# Patient Record
Sex: Female | Born: 1960 | Race: Black or African American | Hispanic: No | Marital: Married | Smoking: Former smoker
Health system: Southern US, Community
[De-identification: ages and names within clinical notes are randomized; demographics above are authoritative.]

## PROBLEM LIST (undated history)

## (undated) DIAGNOSIS — I10 Essential (primary) hypertension: Secondary | ICD-10-CM

## (undated) DIAGNOSIS — K219 Gastro-esophageal reflux disease without esophagitis: Secondary | ICD-10-CM

## (undated) DIAGNOSIS — E7800 Pure hypercholesterolemia, unspecified: Secondary | ICD-10-CM

## (undated) DIAGNOSIS — E119 Type 2 diabetes mellitus without complications: Secondary | ICD-10-CM

---

## 2001-06-18 HISTORY — PX: ABDOMINAL HYSTERECTOMY: SHX81

## 2005-04-13 ENCOUNTER — Ambulatory Visit: Payer: Self-pay | Admitting: Obstetrics and Gynecology

## 2006-04-15 ENCOUNTER — Ambulatory Visit: Payer: Self-pay | Admitting: Obstetrics and Gynecology

## 2006-05-10 ENCOUNTER — Ambulatory Visit: Payer: Self-pay | Admitting: Nurse Practitioner

## 2007-04-29 ENCOUNTER — Ambulatory Visit: Payer: Self-pay | Admitting: Nurse Practitioner

## 2007-07-29 ENCOUNTER — Ambulatory Visit: Payer: Self-pay

## 2007-08-13 ENCOUNTER — Ambulatory Visit: Payer: Self-pay | Admitting: Nurse Practitioner

## 2008-05-12 ENCOUNTER — Ambulatory Visit: Payer: Self-pay | Admitting: Nurse Practitioner

## 2009-06-20 ENCOUNTER — Ambulatory Visit: Payer: Self-pay | Admitting: Family Medicine

## 2010-07-11 ENCOUNTER — Ambulatory Visit: Payer: Self-pay | Admitting: Family Medicine

## 2010-10-02 ENCOUNTER — Ambulatory Visit: Payer: Self-pay | Admitting: Gastroenterology

## 2010-10-02 HISTORY — PX: COLONOSCOPY: SHX174

## 2011-07-25 ENCOUNTER — Ambulatory Visit: Payer: Self-pay | Admitting: Family Medicine

## 2011-10-17 ENCOUNTER — Emergency Department: Payer: Self-pay | Admitting: Emergency Medicine

## 2011-10-29 ENCOUNTER — Encounter: Payer: Self-pay | Admitting: Family Medicine

## 2012-09-08 ENCOUNTER — Ambulatory Visit: Payer: Self-pay | Admitting: Family Medicine

## 2014-02-18 ENCOUNTER — Ambulatory Visit: Payer: Self-pay | Admitting: Family Medicine

## 2014-05-10 ENCOUNTER — Ambulatory Visit: Payer: Self-pay | Admitting: Gastroenterology

## 2014-10-11 LAB — SURGICAL PATHOLOGY

## 2015-03-23 ENCOUNTER — Other Ambulatory Visit: Payer: Self-pay | Admitting: Family Medicine

## 2015-03-23 DIAGNOSIS — Z1231 Encounter for screening mammogram for malignant neoplasm of breast: Secondary | ICD-10-CM

## 2015-03-25 ENCOUNTER — Ambulatory Visit: Payer: Self-pay

## 2015-03-31 ENCOUNTER — Ambulatory Visit
Admission: RE | Admit: 2015-03-31 | Discharge: 2015-03-31 | Disposition: A | Discharge status: Home / Self Care | Payer: BLUE CROSS/BLUE SHIELD | Source: Ambulatory Visit | Attending: Family Medicine | Admitting: Family Medicine

## 2015-03-31 DIAGNOSIS — Z1231 Encounter for screening mammogram for malignant neoplasm of breast: Secondary | ICD-10-CM | POA: Insufficient documentation

## 2016-04-18 ENCOUNTER — Other Ambulatory Visit: Payer: Self-pay | Admitting: Family Medicine

## 2016-04-18 DIAGNOSIS — Z1231 Encounter for screening mammogram for malignant neoplasm of breast: Secondary | ICD-10-CM

## 2016-05-24 ENCOUNTER — Ambulatory Visit
Admission: RE | Admit: 2016-05-24 | Discharge: 2016-05-24 | Disposition: A | Discharge status: Home / Self Care | Payer: BLUE CROSS/BLUE SHIELD | Source: Ambulatory Visit | Attending: Family Medicine | Admitting: Family Medicine

## 2016-05-24 DIAGNOSIS — Z1231 Encounter for screening mammogram for malignant neoplasm of breast: Secondary | ICD-10-CM | POA: Diagnosis not present

## 2017-05-16 ENCOUNTER — Other Ambulatory Visit: Payer: Self-pay | Admitting: Family Medicine

## 2017-05-16 DIAGNOSIS — Z1231 Encounter for screening mammogram for malignant neoplasm of breast: Secondary | ICD-10-CM

## 2017-05-29 ENCOUNTER — Ambulatory Visit
Admission: RE | Admit: 2017-05-29 | Discharge: 2017-05-29 | Disposition: A | Discharge status: Home / Self Care | Payer: BLUE CROSS/BLUE SHIELD | Source: Ambulatory Visit | Attending: Family Medicine | Admitting: Family Medicine

## 2017-05-29 DIAGNOSIS — Z1231 Encounter for screening mammogram for malignant neoplasm of breast: Secondary | ICD-10-CM | POA: Diagnosis not present

## 2018-04-21 ENCOUNTER — Other Ambulatory Visit: Payer: Self-pay | Admitting: Family Medicine

## 2018-04-21 DIAGNOSIS — Z1231 Encounter for screening mammogram for malignant neoplasm of breast: Secondary | ICD-10-CM

## 2018-06-02 ENCOUNTER — Ambulatory Visit
Admission: RE | Admit: 2018-06-02 | Discharge: 2018-06-02 | Disposition: A | Discharge status: Home / Self Care | Payer: BLUE CROSS/BLUE SHIELD | Source: Ambulatory Visit | Attending: Family Medicine | Admitting: Family Medicine

## 2018-06-02 DIAGNOSIS — Z1231 Encounter for screening mammogram for malignant neoplasm of breast: Secondary | ICD-10-CM | POA: Insufficient documentation

## 2019-04-30 ENCOUNTER — Other Ambulatory Visit: Payer: Self-pay | Admitting: Family Medicine

## 2019-04-30 DIAGNOSIS — Z1231 Encounter for screening mammogram for malignant neoplasm of breast: Secondary | ICD-10-CM

## 2019-06-05 ENCOUNTER — Ambulatory Visit
Admission: RE | Admit: 2019-06-05 | Discharge: 2019-06-05 | Disposition: A | Discharge status: Home / Self Care | Payer: BC Managed Care – PPO | Source: Ambulatory Visit | Attending: Family Medicine | Admitting: Family Medicine

## 2019-06-05 DIAGNOSIS — Z1231 Encounter for screening mammogram for malignant neoplasm of breast: Secondary | ICD-10-CM | POA: Insufficient documentation

## 2020-05-25 ENCOUNTER — Other Ambulatory Visit: Payer: Self-pay | Admitting: Family Medicine

## 2020-05-25 DIAGNOSIS — Z1231 Encounter for screening mammogram for malignant neoplasm of breast: Secondary | ICD-10-CM

## 2020-06-07 ENCOUNTER — Ambulatory Visit
Admission: RE | Admit: 2020-06-07 | Discharge: 2020-06-07 | Disposition: A | Discharge status: Home / Self Care | Payer: BC Managed Care – PPO | Source: Ambulatory Visit | Attending: Family Medicine | Admitting: Family Medicine

## 2020-06-07 ENCOUNTER — Other Ambulatory Visit: Payer: Self-pay

## 2020-06-07 DIAGNOSIS — Z1231 Encounter for screening mammogram for malignant neoplasm of breast: Secondary | ICD-10-CM | POA: Diagnosis present

## 2021-05-24 ENCOUNTER — Other Ambulatory Visit: Payer: Self-pay | Admitting: Family Medicine

## 2021-05-24 DIAGNOSIS — Z1231 Encounter for screening mammogram for malignant neoplasm of breast: Secondary | ICD-10-CM

## 2021-07-20 ENCOUNTER — Ambulatory Visit
Admission: RE | Admit: 2021-07-20 | Discharge: 2021-07-20 | Disposition: A | Discharge status: Home / Self Care | Payer: BC Managed Care – PPO | Source: Ambulatory Visit | Attending: Family Medicine | Admitting: Family Medicine

## 2021-07-20 ENCOUNTER — Other Ambulatory Visit: Payer: Self-pay

## 2021-07-20 DIAGNOSIS — Z1231 Encounter for screening mammogram for malignant neoplasm of breast: Secondary | ICD-10-CM | POA: Diagnosis present

## 2021-08-23 ENCOUNTER — Encounter: Payer: Self-pay | Admitting: Orthopedic Surgery

## 2021-08-23 ENCOUNTER — Other Ambulatory Visit: Payer: Self-pay | Admitting: Orthopedic Surgery

## 2021-08-23 NOTE — H&P (Signed)
Melinda Bean ?MRN:  [id number] ?DOB/SEX:  [birth year]female ? ?CHIEF COMPLAINT:  Painful right Knee ? ?HISTORY: ?Patient is a 61 y.o. female presented with a history of pain in the right knee. Onset of symptoms was abrupt starting several months ago with rapidly worsening course since that time. Prior procedures on the knee include none. Patient has been treated conservatively with over-the-counter NSAIDs and activity modification. Patient currently rates pain in the knee at 10 out of 10 with activity. There is pain at night. ? ?PAST MEDICAL HISTORY: ?There are no problems to display for this patient. ? ?No past medical history on file. ?No past surgical history on file.  ? ?MEDICATIONS:  (Not in a hospital admission) ? ? ?ALLERGIES:  Not on File ? ?REVIEW OF SYSTEMS:  ?Pertinent items are noted in HPI. ? ? ?FAMILY HISTORY:   ?Family History  ?Problem Relation Age of Onset  ? Breast cancer Sister 20  ? Breast cancer Sister 30  ? Breast cancer Sister   ? ? ?SOCIAL HISTORY:   ?Social History  ? ?Tobacco Use  ? Smoking status: Not on file  ? Smokeless tobacco: Not on file  ?Substance Use Topics  ? Alcohol use: Not on file  ? ?  ?EXAMINATION: ? ?Vital signs in last 24 hours: ?@VSRANGES @ ? ?General appearance: alert, cooperative, and no distress ?Neck: no JVD and supple, symmetrical, trachea midline ?Lungs: clear to auscultation bilaterally ?Heart: regular rate and rhythm, S1, S2 normal, no murmur, click, rub or gallop ?Abdomen: soft, non-tender; bowel sounds normal; no masses,  no organomegaly ?Extremities: extremities normal, atraumatic, no cyanosis or edema and Homans sign is negative, no sign of DVT ?Pulses: 2+ and symmetric ?Skin: Skin color, texture, turgor normal. No rashes or lesions ? ?Musculoskeletal:  ROM 0-110, Ligaments intact,  ?Imaging Review ?MRI medial meniscal tear ? ?Assessment/Plan: ?Medial meniscal tear ? ?Right knee arthroscopy ? ? ? ? ?08/23/2021, 3:56 PM  ? ?

## 2021-09-04 ENCOUNTER — Other Ambulatory Visit: Payer: Self-pay

## 2021-09-04 ENCOUNTER — Encounter
Admission: RE | Admit: 2021-09-04 | Discharge: 2021-09-04 | Disposition: A | Discharge status: Home / Self Care | Payer: BC Managed Care – PPO | Source: Ambulatory Visit | Attending: Orthopedic Surgery | Admitting: Orthopedic Surgery

## 2021-09-04 VITALS — Ht 67.5 in | Wt 210.0 lb

## 2021-09-04 DIAGNOSIS — Z01818 Encounter for other preprocedural examination: Secondary | ICD-10-CM

## 2021-09-04 HISTORY — DX: Type 2 diabetes mellitus without complications: E11.9

## 2021-09-04 HISTORY — DX: Gastro-esophageal reflux disease without esophagitis: K21.9

## 2021-09-04 HISTORY — DX: Essential (primary) hypertension: I10

## 2021-09-04 HISTORY — DX: Pure hypercholesterolemia, unspecified: E78.00

## 2021-09-04 NOTE — Patient Instructions (Addendum)
Your procedure is scheduled on: Monday 09/11/21 ?Report to the Registration Desk on the 1st floor of the [facility]. ?To find out your arrival time, please call [phone] between 1PM - 3PM on: Friday 09/08/21 ? ?REMEMBER: ?Instructions that are not followed completely may result in serious medical risk, up to and including death; or upon the discretion of your surgeon and anesthesiologist your surgery may need to be rescheduled. ? ?Do not eat or drink after midnight the night before surgery.  ?No gum chewing, lozengers or hard candies. ? ?TAKE THESE MEDICATIONS THE MORNING OF SURGERY WITH A SIP OF WATER: ?amLODipine (NORVASC) 10 MG tablet ?metoprolol tartrate (LOPRESSOR) 75 MG tablet ? ?pantoprazole (PROTONIX) 40 MG tablet (take one the night before and one on the morning of surgery - helps to prevent nausea after surgery.) ? ?Stop taking your Metformin 2 days prior to surgery. Take last dose on Friday 09/08/21 . ? ?Stop taking your aspirin 5 days prior to surgery. Take last dose on Tuesday 09/05/21. ? ?One week prior to surgery: ?Stop Anti-inflammatories (NSAIDS) such as Advil, Aleve, Ibuprofen, Motrin, Naproxen, Naprosyn and Aspirin based products such as Excedrin, Goodys Powder, BC Powder. ? ?Stop taking your cholecalciferol (VITAMIN D) 25 MCG (1000 UNIT) tablet, Multiple Vitamin (MULTIVITAMIN WITH MINERALS) TABS tablet, and ANY other OVER THE COUNTER supplements until after surgery. ?You may however, continue to take Tylenol if needed for pain up until the day of surgery. ? ?No Alcohol for 24 hours before or after surgery. ? ?No Smoking including e-cigarettes for 24 hours prior to surgery.  ?No chewable tobacco products for at least 6 hours prior to surgery.  ?No nicotine patches on the day of surgery. ? ?Do not use any "recreational" drugs for at least a week prior to your surgery.  ?Please be advised that the combination of cocaine and anesthesia may have negative outcomes, up to and including death. ?If  you test positive for cocaine, your surgery will be cancelled. ? ?On the morning of surgery brush your teeth with toothpaste and water, you may rinse your mouth with mouthwash if you wish. ?Do not swallow any toothpaste or mouthwash. ? ?Use CHG Soap as directed on instruction sheet. ? ?Do not wear jewelry, make-up, hairpins, clips or nail polish. ? ?Do not wear lotions, powders, or perfumes.  ? ?Do not shave body from the neck down 48 hours prior to surgery just in case you cut yourself which could leave a site for infection.  ?Also, freshly shaved skin may become irritated if using the CHG soap. ? ?Do not bring valuables to the hospital. [facility] is not responsible for any missing/lost belongings or valuables.  ? ?Notify your doctor if there is any change in your medical condition (cold, fever, infection). ? ?Wear comfortable clothing (specific to your surgery type) to the hospital. ? ?After surgery, you can help prevent lung complications by doing breathing exercises.  ?Take deep breaths and cough every 1-2 hours. Your doctor may order a device called an Incentive Spirometer to help you take deep breaths. ? ?If you are being discharged the day of surgery, you will not be allowed to drive home. ?You will need a responsible adult (18 years or older) to drive you home and stay with you that night.  ? ?If you are taking public transportation, you will need to have a responsible adult (18 years or older) with you. ?Please confirm with your physician that it is acceptable to use public transportation.  ? ?Please  call the Pre-admissions Testing Dept. at [phone] if you have any questions about these instructions. ? ?Surgery Visitation Policy: ? ?Patients undergoing a surgery or procedure may have one family member or support person with them as long as that person is not COVID-19 positive or experiencing its symptoms.  ?That person may remain in the waiting area during the procedure and may rotate out with other  people. ? ?Inpatient Visitation:   ? ?Visiting hours are 7 a.m. to 8 p.m. ?Up to two visitors ages 16+ are allowed at one time in a patient room. The visitors may rotate out with other people during the day. Visitors must check out when they leave, or other visitors will not be allowed. One designated support person may remain overnight. ?The visitor must pass COVID-19 screenings, use hand sanitizer when entering and exiting the patient?s room and wear a mask at all times, including in the patient?s room. ?Patients must also wear a mask when staff or their visitor are in the room. ?Masking is required regardless of vaccination status.  ?

## 2021-09-05 ENCOUNTER — Encounter
Admission: RE | Admit: 2021-09-05 | Discharge: 2021-09-05 | Disposition: A | Discharge status: Home / Self Care | Payer: BC Managed Care – PPO | Source: Ambulatory Visit | Attending: Orthopedic Surgery | Admitting: Orthopedic Surgery

## 2021-09-05 ENCOUNTER — Encounter: Payer: Self-pay | Admitting: Urgent Care

## 2021-09-05 DIAGNOSIS — Z01818 Encounter for other preprocedural examination: Secondary | ICD-10-CM | POA: Diagnosis present

## 2021-09-05 DIAGNOSIS — Z01810 Encounter for preprocedural cardiovascular examination: Secondary | ICD-10-CM | POA: Diagnosis not present

## 2021-09-05 LAB — BASIC METABOLIC PANEL
Anion gap: 8 (ref 5–15)
BUN: 11 mg/dL (ref 8–23)
CO2: 30 mmol/L (ref 22–32)
Calcium: 9.7 mg/dL (ref 8.9–10.3)
Chloride: 102 mmol/L (ref 98–111)
Creatinine, Ser: 0.64 mg/dL (ref 0.44–1.00)
GFR, Estimated: >60 mL/min (ref >60)
Glucose, Bld: 115 mg/dL — ABNORMAL HIGH (ref 70–99)
Potassium: 3.1 mmol/L — ABNORMAL LOW (ref 3.5–5.1)
Sodium: 140 mmol/L (ref 135–145)

## 2021-09-06 ENCOUNTER — Telehealth: Payer: Self-pay | Admitting: Urgent Care

## 2021-09-06 DIAGNOSIS — Z01812 Encounter for preprocedural laboratory examination: Secondary | ICD-10-CM

## 2021-09-06 DIAGNOSIS — E876 Hypokalemia: Secondary | ICD-10-CM

## 2021-09-06 MED ORDER — POTASSIUM CHLORIDE CRYS ER 20 MEQ PO TBCR: 20.0000 meq | EXTENDED_RELEASE_TABLET | Freq: Every day | ORAL | 0 refills | Status: AC

## 2021-09-06 NOTE — Progress Notes (Signed)
?  Melinda Bean ?Perioperative Services: Pre-Admission/Anesthesia Testing ? ?Abnormal Lab Notification ?  ?Date: 09/06/21 ? ?Name: Melinda Bean Melinda Bean ?MRN:   [id number] ? ?Re: Abnormal labs noted during PAT appointment  ? ?Notified:  ?Provider Name Provider Role Notification Mode  ?Cassell Smiles, MD Orthopedics (Surgeon) Routed and/or faxed via [name]  ?Darreld Mclean, MD Primary Care Provider Routed and/or faxed via [name]  ? ?Clinical Information and Notes:  ?ABNORMAL LAB VALUE(S): ?Lab Results  ?Component Value Date  ? K 3.1 (L) 09/05/2021  ? ?Melinda Bean is scheduled for an elective RIGHT KNEE ARTHROSCOPY WITH MEDIAL MENISECTOMY on 09/11/2021. In review of her medication reconciliation, it is noted that the patient is taking prescribed diuretic medications (HCTZ 25 mg). Please note, in efforts to promote a safe and effective anesthetic course, per current guidelines/standards set by the [name] anesthesia team, the minimal acceptable K+ level for the patient to proceed with general anesthesia is 3.0 mmol/L. With that being said, if the patient drops any lower, her elective procedure will need to be postponed until K+ is better optimized. To avoid complications and potential case cancellation, we will send in prescription for oral supplement in efforts to optimize patient's preprocedural K+ level ? ?Impression and Plans:  ?Melinda Bean is scheduled for the above procedure on 09/11/2021.  As part of her preoperative work-up, patient presented to the PAT clinic on the morning of 09/06/2021 for lab work.  Patient noted to be mildly hypokalemic at 3.1 mmol/L.  Sending in a prescription for oral replacement therapy as follows: ? ?Meds ordered this encounter  ?Medications  ? potassium chloride SA (KLOR-CON M) 20 MEQ tablet  ?  Sig: Take 1 tablet (20 mEq total) by mouth daily for 5 days.  ?  Dispense:  5 tablet  ?  Refill:  0  ? ?Patient encouraged to take medication as prescribed.  Additionally, she  was advised to follow-up with her PCP following surgery to discuss need for repeat lab work and potential change in her diuretic medication.  Alternatively, could consider daily oral K+ supplementation. Will defer that decision to her PCP. Order placed to have K+ rechecked on the day of her procedure to ensure correction of the noted derangement.   ? 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 ? ?Encounter Diagnoses  ?Name Primary?  ? Diuretic-induced hypokalemia   ? Pre-operative laboratory examination Yes  ? ?Melinda Mulling, MSN, APRN, FNP-C, CEN ?[facility]  ?Peri-operative Services Nurse Practitioner ?Phone: [phone] ?Fax: [phone] ?09/06/21 1:14 PM ? ?

## 2021-09-11 ENCOUNTER — Other Ambulatory Visit: Payer: Self-pay

## 2021-09-11 ENCOUNTER — Ambulatory Visit: Payer: BC Managed Care – PPO | Admitting: Urgent Care

## 2021-09-11 ENCOUNTER — Encounter
Admission: RE | Disposition: A | Discharge status: Home / Self Care | Payer: Self-pay | Source: Home / Self Care | Attending: Orthopedic Surgery

## 2021-09-11 ENCOUNTER — Ambulatory Visit
Admission: RE | Admit: 2021-09-11 | Discharge: 2021-09-11 | Disposition: A | Discharge status: Home / Self Care | Payer: BC Managed Care – PPO | Attending: Orthopedic Surgery | Admitting: Orthopedic Surgery

## 2021-09-11 DIAGNOSIS — X58XXXA Exposure to other specified factors, initial encounter: Secondary | ICD-10-CM | POA: Insufficient documentation

## 2021-09-11 DIAGNOSIS — E119 Type 2 diabetes mellitus without complications: Secondary | ICD-10-CM | POA: Insufficient documentation

## 2021-09-11 DIAGNOSIS — I10 Essential (primary) hypertension: Secondary | ICD-10-CM | POA: Insufficient documentation

## 2021-09-11 DIAGNOSIS — S83206A Unspecified tear of unspecified meniscus, current injury, right knee, initial encounter: Secondary | ICD-10-CM | POA: Diagnosis present

## 2021-09-11 DIAGNOSIS — K219 Gastro-esophageal reflux disease without esophagitis: Secondary | ICD-10-CM | POA: Diagnosis not present

## 2021-09-11 DIAGNOSIS — Z87891 Personal history of nicotine dependence: Secondary | ICD-10-CM | POA: Diagnosis not present

## 2021-09-11 DIAGNOSIS — Z7984 Long term (current) use of oral hypoglycemic drugs: Secondary | ICD-10-CM | POA: Diagnosis not present

## 2021-09-11 HISTORY — PX: KNEE ARTHROSCOPY WITH MEDIAL MENISECTOMY: SHX5651

## 2021-09-11 LAB — POCT I-STAT, CHEM 8
BUN: 10 mg/dL (ref 8–23)
Calcium, Ion: 1.19 mmol/L (ref 1.15–1.40)
Chloride: 102 mmol/L (ref 98–111)
Creatinine, Ser: 0.6 mg/dL (ref 0.44–1.00)
Glucose, Bld: 195 mg/dL — ABNORMAL HIGH (ref 70–99)
HCT: 39 % (ref 36.0–46.0)
Hemoglobin: 13.3 g/dL (ref 12.0–15.0)
Potassium: 3.5 mmol/L (ref 3.5–5.1)
Sodium: 141 mmol/L (ref 135–145)
TCO2: 27 mmol/L (ref 22–32)

## 2021-09-11 LAB — GLUCOSE, CAPILLARY: Glucose-Capillary: 217 mg/dL — ABNORMAL HIGH (ref 70–99)

## 2021-09-11 SURGERY — ARTHROSCOPY, KNEE, WITH MEDIAL MENISCECTOMY
Anesthesia: General | Site: Knee | Laterality: Right

## 2021-09-11 MED ORDER — PHENYLEPHRINE 40 MCG/ML (10ML) SYRINGE FOR IV PUSH (FOR BLOOD PRESSURE SUPPORT)
PREFILLED_SYRINGE | INTRAVENOUS | Status: DC | PRN
  Administered 2021-09-11 (×2): 80 ug via INTRAVENOUS

## 2021-09-11 MED ORDER — ROPIVACAINE HCL 5 MG/ML IJ SOLN
INTRAMUSCULAR | Status: DC | PRN
  Administered 2021-09-11: 21 mL via INTRAMUSCULAR

## 2021-09-11 MED ORDER — EPINEPHRINE PF 1 MG/ML IJ SOLN
INTRAMUSCULAR | Status: AC
  Filled 2021-09-11: qty 4

## 2021-09-11 MED ORDER — KETOROLAC TROMETHAMINE 30 MG/ML IJ SOLN
INTRAMUSCULAR | Status: DC | PRN
  Administered 2021-09-11: 30 mg via INTRAVENOUS

## 2021-09-11 MED ORDER — PHENYLEPHRINE HCL-NACL 20-0.9 MG/250ML-% IV SOLN
INTRAVENOUS | Status: AC
  Filled 2021-09-11: qty 250

## 2021-09-11 MED ORDER — LIDOCAINE HCL (CARDIAC) PF 100 MG/5ML IV SOSY
PREFILLED_SYRINGE | INTRAVENOUS | Status: DC | PRN
  Administered 2021-09-11: 100 mg via INTRAVENOUS

## 2021-09-11 MED ORDER — FAMOTIDINE 20 MG PO TABS
ORAL_TABLET | ORAL | Status: AC
  Filled 2021-09-11: qty 1

## 2021-09-11 MED ORDER — HYDROCODONE-ACETAMINOPHEN 5-325 MG PO TABS: 1.0000 | ORAL_TABLET | Freq: Four times a day (QID) | ORAL | 0 refills | Status: AC | PRN

## 2021-09-11 MED ORDER — CHLORHEXIDINE GLUCONATE 0.12 % MT SOLN
OROMUCOSAL | Status: AC
  Filled 2021-09-11: qty 15

## 2021-09-11 MED ORDER — DEXAMETHASONE SODIUM PHOSPHATE 10 MG/ML IJ SOLN
INTRAMUSCULAR | Status: AC
  Filled 2021-09-11: qty 1

## 2021-09-11 MED ORDER — FENTANYL CITRATE (PF) 100 MCG/2ML IJ SOLN: 25.0000 ug | INTRAMUSCULAR | Status: DC | PRN

## 2021-09-11 MED ORDER — BUPIVACAINE-EPINEPHRINE (PF) 0.25% -1:200000 IJ SOLN
INTRAMUSCULAR | Status: AC
  Filled 2021-09-11: qty 30

## 2021-09-11 MED ORDER — PHENYLEPHRINE HCL (PRESSORS) 10 MG/ML IV SOLN
INTRAVENOUS | Status: DC | PRN
  Administered 2021-09-11: 80 ug via INTRAVENOUS

## 2021-09-11 MED ORDER — EPINEPHRINE PF 1 MG/ML IJ SOLN
INTRAMUSCULAR | Status: DC | PRN
  Administered 2021-09-11 (×2): 3001 mL

## 2021-09-11 MED ORDER — CEFAZOLIN SODIUM-DEXTROSE 2-4 GM/100ML-% IV SOLN
INTRAVENOUS | Status: AC
  Filled 2021-09-11: qty 100

## 2021-09-11 MED ORDER — ROPIVACAINE HCL 5 MG/ML IJ SOLN
INTRAMUSCULAR | Status: AC
  Filled 2021-09-11: qty 20

## 2021-09-11 MED ORDER — ACETAMINOPHEN 10 MG/ML IV SOLN
INTRAVENOUS | Status: AC
Start: 2021-09-11 — End: ?
  Filled 2021-09-11: qty 100

## 2021-09-11 MED ORDER — METHYLPREDNISOLONE ACETATE 40 MG/ML IJ SUSP
INTRAMUSCULAR | Status: AC
  Filled 2021-09-11: qty 1

## 2021-09-11 MED ORDER — FENTANYL CITRATE (PF) 100 MCG/2ML IJ SOLN
INTRAMUSCULAR | Status: DC | PRN
Start: 2021-09-11 — End: 2021-09-11
  Administered 2021-09-11 (×3): 25 ug via INTRAVENOUS

## 2021-09-11 MED ORDER — DOCUSATE SODIUM 100 MG PO CAPS: 100.0000 mg | ORAL_CAPSULE | Freq: Every day | ORAL | 2 refills | Status: AC | PRN

## 2021-09-11 MED ORDER — CEFAZOLIN SODIUM-DEXTROSE 2-4 GM/100ML-% IV SOLN
2.0000 g | INTRAVENOUS | Status: AC
  Administered 2021-09-11: 2 g via INTRAVENOUS

## 2021-09-11 MED ORDER — ONDANSETRON HCL 4 MG/2ML IJ SOLN: 4.0000 mg | Freq: Once | INTRAMUSCULAR | Status: DC | PRN

## 2021-09-11 MED ORDER — ONDANSETRON HCL 4 MG/2ML IJ SOLN
INTRAMUSCULAR | Status: AC
  Filled 2021-09-11: qty 2

## 2021-09-11 MED ORDER — CHLORHEXIDINE GLUCONATE 0.12 % MT SOLN
15.0000 mL | Freq: Once | OROMUCOSAL | Status: AC
  Administered 2021-09-11: 15 mL via OROMUCOSAL

## 2021-09-11 MED ORDER — DEXAMETHASONE SODIUM PHOSPHATE 10 MG/ML IJ SOLN
INTRAMUSCULAR | Status: DC | PRN
Start: 2021-09-11 — End: 2021-09-11
  Administered 2021-09-11: 5 mg via INTRAVENOUS

## 2021-09-11 MED ORDER — LACTATED RINGERS IV SOLN: INTRAVENOUS | Status: DC

## 2021-09-11 MED ORDER — EPHEDRINE SULFATE (PRESSORS) 50 MG/ML IJ SOLN
INTRAMUSCULAR | Status: DC | PRN
  Administered 2021-09-11 (×2): 10 mg via INTRAVENOUS

## 2021-09-11 MED ORDER — FENTANYL CITRATE (PF) 100 MCG/2ML IJ SOLN
INTRAMUSCULAR | Status: AC
  Filled 2021-09-11: qty 2

## 2021-09-11 MED ORDER — MIDAZOLAM HCL 2 MG/2ML IJ SOLN
INTRAMUSCULAR | Status: DC | PRN
  Administered 2021-09-11: 1.5 mg via INTRAVENOUS
  Administered 2021-09-11: .5 mg via INTRAVENOUS

## 2021-09-11 MED ORDER — LIDOCAINE HCL (PF) 2 % IJ SOLN
INTRAMUSCULAR | Status: AC
  Filled 2021-09-11: qty 5

## 2021-09-11 MED ORDER — BUPIVACAINE-EPINEPHRINE (PF) 0.25% -1:200000 IJ SOLN
INTRAMUSCULAR | Status: DC | PRN
  Administered 2021-09-11: 15 mL

## 2021-09-11 MED ORDER — ONDANSETRON HCL 4 MG/2ML IJ SOLN
INTRAMUSCULAR | Status: DC | PRN
Start: 2021-09-11 — End: 2021-09-11
  Administered 2021-09-11: 4 mg via INTRAVENOUS

## 2021-09-11 MED ORDER — MIDAZOLAM HCL 2 MG/2ML IJ SOLN
INTRAMUSCULAR | Status: AC
  Filled 2021-09-11: qty 2

## 2021-09-11 MED ORDER — PROPOFOL 10 MG/ML IV BOLUS
INTRAVENOUS | Status: AC
  Filled 2021-09-11: qty 20

## 2021-09-11 MED ORDER — ACETAMINOPHEN 10 MG/ML IV SOLN
INTRAVENOUS | Status: DC | PRN
  Administered 2021-09-11: 1000 mg via INTRAVENOUS

## 2021-09-11 MED ORDER — ORAL CARE MOUTH RINSE: 15.0000 mL | Freq: Once | OROMUCOSAL | Status: AC

## 2021-09-11 SURGICAL SUPPLY — 41 items
ADAPTER IRRIG TUBE 2 SPIKE SOL (ADAPTER) ×4 IMPLANT
ADPR TBG 2 SPK PMP STRL ASCP (ADAPTER) ×2
APL PRP STRL LF DISP 70% ISPRP (MISCELLANEOUS) ×1
BLADE FULL RADIUS 3.5 (BLADE) IMPLANT
BLADE INCISOR PLUS 4.5 (BLADE) ×2 IMPLANT
BLADE SHAVER 4.5 DBL SERAT CV (CUTTER) ×1 IMPLANT
BLADE SURG SZ11 CARB STEEL (BLADE) ×2 IMPLANT
BRUSH SCRUB EZ  4% CHG (MISCELLANEOUS) ×2
BRUSH SCRUB EZ 4% CHG (MISCELLANEOUS) ×2 IMPLANT
CHLORAPREP W/TINT 26 (MISCELLANEOUS) ×2 IMPLANT
COOLER POLAR GLACIER W/PUMP (MISCELLANEOUS) ×2 IMPLANT
DRAPE ARTHRO LIMB 89X125 STRL (DRAPES) ×2 IMPLANT
DRAPE ORTHO SPLIT 77X108 STRL (DRAPES) ×2
DRAPE SURG ORHT 6 SPLT 77X108 (DRAPES) ×1 IMPLANT
GAUZE SPONGE 4X4 12PLY STRL (GAUZE/BANDAGES/DRESSINGS) ×2 IMPLANT
GAUZE XEROFORM 1X8 LF (GAUZE/BANDAGES/DRESSINGS) ×2 IMPLANT
GLOVE SURG ORTHO LTX SZ8.5 (GLOVE) ×2 IMPLANT
GLOVE SURG UNDER POLY LF SZ8.5 (GLOVE) ×2 IMPLANT
GOWN STRL REUS W/ TWL LRG LVL3 (GOWN DISPOSABLE) ×1 IMPLANT
GOWN STRL REUS W/ TWL XL LVL3 (GOWN DISPOSABLE) ×1 IMPLANT
GOWN STRL REUS W/TWL LRG LVL3 (GOWN DISPOSABLE) ×2
GOWN STRL REUS W/TWL XL LVL3 (GOWN DISPOSABLE) ×2
IV LACTATED RINGER IRRG 3000ML (IV SOLUTION) ×4
IV LR IRRIG 3000ML ARTHROMATIC (IV SOLUTION) ×4 IMPLANT
KIT TURNOVER KIT A (KITS) ×2 IMPLANT
MANIFOLD NEPTUNE II (INSTRUMENTS) ×4 IMPLANT
MAT ABSORB  FLUID 56X50 GRAY (MISCELLANEOUS) ×1
MAT ABSORB FLUID 56X50 GRAY (MISCELLANEOUS) ×1 IMPLANT
NDL SPNL 20GX3.5 QUINCKE YW (NEEDLE) ×1 IMPLANT
NEEDLE SPNL 20GX3.5 QUINCKE YW (NEEDLE) ×2 IMPLANT
PACK ARTHROSCOPY KNEE (MISCELLANEOUS) ×2 IMPLANT
PAD ABD DERMACEA PRESS 5X9 (GAUZE/BANDAGES/DRESSINGS) ×4 IMPLANT
PAD WRAPON POLAR KNEE (MISCELLANEOUS) ×1 IMPLANT
SPONGE T-LAP 18X18 ~~LOC~~+RFID (SPONGE) ×2 IMPLANT
SUT ETHILON 4-0 (SUTURE) ×2
SUT ETHILON 4-0 FS2 18XMFL BLK (SUTURE) ×1
SUTURE ETHLN 4-0 FS2 18XMF BLK (SUTURE) ×1 IMPLANT
TUBING INFLOW SET DBFLO PUMP (TUBING) ×2 IMPLANT
WAND WEREWOLF FLOW 90D (MISCELLANEOUS) ×2 IMPLANT
WATER STERILE IRR 500ML POUR (IV SOLUTION) ×2 IMPLANT
WRAPON POLAR PAD KNEE (MISCELLANEOUS) ×2

## 2021-09-11 NOTE — Anesthesia Preprocedure Evaluation (Signed)
Anesthesia Evaluation  ?Patient identified by MRN, date of birth, ID band ?Patient awake ? ? ? ?Reviewed: ?Allergy & Precautions, H&P , NPO status , Patient's Chart, lab work & pertinent test results, reviewed documented beta blocker date and time  ? ?Airway ?Mallampati: II ? ?TM Distance: >3 FB ?Neck ROM: full ? ? ? Dental ? ?(+) Teeth Intact, Chipped, Dental Advidsory Given, Poor Dentition ?  ?Pulmonary ?neg pulmonary ROS, former smoker,  ?  ?Pulmonary exam normal ? ? ? ? ? ? ? Cardiovascular ?Exercise Tolerance: Poor ?hypertension, On Medications ?negative cardio ROS ?Normal cardiovascular exam ?Rate:Normal ? ? ?  ?Neuro/Psych ?negative neurological ROS ? negative psych ROS  ? GI/Hepatic ?Neg liver ROS, GERD  Medicated,  ?Endo/Other  ?negative endocrine ROSdiabetes, Well Controlled, Type 2, Oral Hypoglycemic Agents ? Renal/GU ?negative Renal ROS  ?negative genitourinary ?  ?Musculoskeletal ? ? Abdominal ?  ?Peds ? Hematology ?negative hematology ROS ?(+)   ?Anesthesia Other Findings ? ? Reproductive/Obstetrics ?negative OB ROS ? ?  ? ? ? ? ? ? ? ? ? ? ? ? ? ?  ?  ? ? ? ? ? ? ? ? ?Anesthesia Physical ?Anesthesia Plan ? ?ASA: 3 ? ?Anesthesia Plan: General LMA  ? ?Post-op Pain Management:   ? ?Induction:  ? ?PONV Risk Score and Plan: 4 or greater ? ?Airway Management Planned:  ? ?Additional Equipment:  ? ?Intra-op Plan:  ? ?Post-operative Plan:  ? ?Informed Consent: I have reviewed the patients History and Physical, chart, labs and discussed the procedure including the risks, benefits and alternatives for the proposed anesthesia with the patient or authorized representative who has indicated his/her understanding and acceptance.  ? ? ? ? ? ?Plan Discussed with: CRNA ? ?Anesthesia Plan Comments:   ? ? ? ? ? ? ?Anesthesia Quick Evaluation ? ?

## 2021-09-11 NOTE — Progress Notes (Signed)
?   09/11/21 0700  ?Clinical Encounter Type  ?Visited With Patient  ?Visit Type Initial;Pre-op  ?Spiritual Encounters  ?Spiritual Needs Prayer  ? ?Chaplain supported patient and family through compassionate presence, conversation and prayer. ?

## 2021-09-11 NOTE — Op Note (Signed)
?PATIENT:  Melinda Bean ? ?PRE-OPERATIVE DIAGNOSIS:  S83.206A Unsp tear of unsp meniscus, current injury, right knee, init ? ?POST-OPERATIVE DIAGNOSIS:  Same ? ?PROCEDURE:  RIGHT KNEE ARTHROSCOPY WITH PARTIAL MEDIAL AND LATERAL MENISECTOMIES, PARTIAL SYNOVECTOMY, AND CHONDROPLASTY ? ?SURGEON:  Kurtis Bushman, MD ? ?ANESTHESIA:   General ? ?PREOPERATIVE INDICATIONS:  Melinda Bean  61 y.o. female with a diagnosis of S83.206A Unsp tear of unsp meniscus, current injury, right knee, init who failed conservative management and elected for surgical management.   ? ?The risks benefits and alternatives were discussed with the patient preoperatively including the risks of infection, bleeding, nerve injury, knee stiffness, persistent pain, osteoarthritis and the need for further surgery. Medical  risks include DVT and pulmonary embolism, myocardial infarction, stroke, pneumonia, respiratory failure and death. The patient understood these risks and wished to proceed. ? ? ?OPERATIVE FINDINGS: The suprapatellar pouch, medial and lateral gutters were inspected and found to be free of any loose bodies. The undersurface of the patella and landing zone were inspected and grade 1 chondromalacia was noted. There was no evidence of lateral subluxation. Extensive synovitis and a medial plica was noted. The medial compartment showed a complex tear of the posterior horn, then anterior horn was intact and stable to probing. Grade 1 and  chondromalacia was identified in the medial compartment.  The notch was inspected and the ACL and PCL were intact and stable to probing. The lateral compartment was entered and minimal degenerative changes were identified. The lateral meniscus had degenerative tearing throughout, most prominent in the mid body. The anterior horn was stable. ? ?OPERATIVE PROCEDURE: Patient was met in the preoperative area. The operative extremity was signed with my initials according the hospital's correct site of  surgery protocol. ? ?The patient was brought to the operating room where they was placed supine on the operative table. General anesthesia was administered. The patient was prepped and draped in a sterile fashion. ? ?A timeout was performed to verify the patient's name, date of birth, medical record number, correct site of surgery correct procedure to be performed. It was also used to verify the patient received antibiotics that all appropriate instruments, and radiographic studies were available in the room. Once all in attendance were in agreement, the case began. ? ?Proposed arthroscopy incisions were drawn out with a surgical marker. These were pre-injected with 0.5% marcaine with epinephrine. An 11 blade was used to establish an inferior lateral and inferomedial portals. The inferomedial portal was created using a 18-gauge spinal needle under direct visualization. ? ?A full diagnostic examination of the knee was performed, please see findings for a complete list of results. ? ?Patient had the meniscal tear treated with a 4-0 resector shaver blade and straight duckbill basket. The meniscus was debrided until a stable rim was achieved. A chondroplasty was performed in all three compartments using the shaver on reverse burr mode. A partial synovectomy was also performed in all three compartments using a 4-0 resector shaver blade and electrocautery. ? ?The knee was then copiously lavaged. All arthroscopic instruments were removed. The 2 arthroscopy portals were closed with 4-0 nylon. A dry sterile and compressive dressing was applied. The patient was brought to the PACU in stable condition. I was scrubbed and present for the entire case and all sharp and instrument counts were correct at the conclusion the case. I spoke with the patient's family postoperatively to let them know the case was performed without complication and the patient was stable in the recovery  room. ? ?Kurtis Bushman, MD ? ? ? ? ?

## 2021-09-11 NOTE — Transfer of Care (Signed)
Immediate Anesthesia Transfer of Care Note ? ?Patient: Melinda Bean [facility] ? ?Procedure(s) Performed: KNEE ARTHROSCOPY WITH PARTIAL MEDIAL AND LATERAL MENISECTOMIES, PARTIAL SYNOVECTOMY, AND CHONDROPLASTY (Right: Knee) ? ?Patient Location: PACU ? ?Anesthesia Type:General ? ?Level of Consciousness: awake ? ?Airway & Oxygen Therapy: Patient Spontanous Breathing and Patient connected to face mask oxygen ? ?Post-op Assessment: Report given to RN and Post -op Vital signs reviewed and stable ? ?Post vital signs: Reviewed and stable ? ?Last Vitals:  ?Vitals Value Taken Time  ?BP 134/78 09/11/21 0847  ?Temp    ?Pulse 61 09/11/21 0847  ?Resp 26 09/11/21 0847  ?SpO2 100 % 09/11/21 0847  ?Vitals shown include unvalidated device data. ? ?Last Pain:  ?Vitals:  ? 09/11/21 0658  ?TempSrc: Temporal  ?PainSc: 0-No pain  ?   ? ?  ? ?Complications: No notable events documented. ?

## 2021-09-11 NOTE — Anesthesia Procedure Notes (Signed)
Procedure Name: LMA Insertion ?Date/Time: 09/11/2021 7:44 AM ?Performed by: Tammi Klippel, CRNA ?Pre-anesthesia Checklist: Patient identified, Patient being monitored, Timeout performed, Emergency Drugs available and Suction available ?Patient Re-evaluated:Patient Re-evaluated prior to induction ?Oxygen Delivery Method: Circle system utilized ?Preoxygenation: Pre-oxygenation with 100% oxygen ?Induction Type: IV induction ?Ventilation: Mask ventilation without difficulty ?LMA: LMA inserted ?LMA Size: 4.5 ?Number of attempts: 1 ?Placement Confirmation: positive ETCO2 ?Tube secured with: Tape ?Dental Injury: Teeth and Oropharynx as per pre-operative assessment  ? ? ? ? ?

## 2021-09-12 ENCOUNTER — Encounter: Payer: Self-pay | Admitting: Orthopedic Surgery

## 2021-09-19 NOTE — Anesthesia Postprocedure Evaluation (Signed)
Anesthesia Post Note ? ?Patient: Melinda Bean [facility] ? ?Procedure(s) Performed: KNEE ARTHROSCOPY WITH PARTIAL MEDIAL AND LATERAL MENISECTOMIES, PARTIAL SYNOVECTOMY, AND CHONDROPLASTY (Right: Knee) ? ?Patient location during evaluation: PACU ?Anesthesia Type: General ?Level of consciousness: awake and alert ?Pain management: pain level controlled ?Vital Signs Assessment: post-procedure vital signs reviewed and stable ?Respiratory status: spontaneous breathing, nonlabored ventilation, respiratory function stable and patient connected to nasal cannula oxygen ?Cardiovascular status: blood pressure returned to baseline and stable ?Postop Assessment: no apparent nausea or vomiting ?Anesthetic complications: no ? ? ?No notable events documented. ? ? ?Last Vitals:  ?Vitals:  ? 09/11/21 0912 09/11/21 0926  ?BP: (!) 154/83 (!) 149/69  ?Pulse: 60 (!) 57  ?Resp: 18 17  ?Temp:  (!) 36.1 ?C  ?SpO2: 96% 99%  ?  ?Last Pain:  ?Vitals:  ? 09/12/21 0816  ?TempSrc:   ?PainSc: 3   ? ? ?  ?  ?  ?  ?  ?  ? ?Yevette Edwards ? ? ? ? ?

## 2021-09-20 NOTE — H&P (Signed)
Melinda Bean ?MRN:  [id number] ?DOB/SEX:  11/04/1960/female ? ?CHIEF COMPLAINT:  Painful right Knee ? ?HISTORY: ?Patient is a 61 y.o. female presented with a history of pain in the right knee. Onset of symptoms was abrupt starting several weeks ago with gradually worsening course since that time. Prior procedures on the knee include none. Patient has been treated conservatively with over-the-counter NSAIDs and activity modification. Patient currently rates pain in the knee at 10 out of 10 with activity. There is no pain at night. ? ?PAST MEDICAL HISTORY: ?There are no problems to display for this patient. ? ?Past Medical History:  ?Diagnosis Date  ? Diabetes mellitus without complication (HCC)   ? type 2  ? GERD (gastroesophageal reflux disease)   ? Hypercholesteremia   ? Hypertension   ? ?Past Surgical History:  ?Procedure Laterality Date  ? ABDOMINAL HYSTERECTOMY  2003  ? COLONOSCOPY  10/02/2010  ? KNEE ARTHROSCOPY WITH MEDIAL MENISECTOMY Right 09/11/2021  ? Procedure: KNEE ARTHROSCOPY WITH PARTIAL MEDIAL AND LATERAL MENISECTOMIES, PARTIAL SYNOVECTOMY, AND CHONDROPLASTY;  Surgeon: Lyndle Herrlich, MD;  Location: [facility] ORS;  Service: Orthopedics;  Laterality: Right;  ?  ? ?MEDICATIONS:   ?No medications prior to admission.  ? ? ?ALLERGIES:   ?Allergies  ?Allergen Reactions  ? Losartan   ?  Other reaction(s): Abdominal Pain  ? ? ?REVIEW OF SYSTEMS:  ?Pertinent items are noted in HPI. ? ? ?FAMILY HISTORY:   ?Family History  ?Problem Relation Age of Onset  ? Breast cancer Sister 75  ? Breast cancer Sister 71  ? Breast cancer Sister   ? ? ?SOCIAL HISTORY:   ?Social History  ? ?Tobacco Use  ? Smoking status: Former  ?  Types: Cigarettes  ?  Quit date: 56  ?  Years since quitting: 40.2  ? Smokeless tobacco: Never  ?Substance Use Topics  ? Alcohol use: Never  ? ?  ?EXAMINATION: ? ?Vital signs in last 24 hours: ?  ? ?General appearance: alert, cooperative, and no distress ?Neck: no JVD and supple, symmetrical, trachea  midline ?Lungs: clear to auscultation bilaterally ?Heart: regular rate and rhythm, S1, S2 normal, no murmur, click, rub or gallop ?Abdomen: soft, non-tender; bowel sounds normal; no masses,  no organomegaly ?Extremities: extremities normal, atraumatic, no cyanosis or edema and Homans sign is negative, no sign of DVT ?Pulses: 2+ and symmetric ?Skin: Skin color, texture, turgor normal. No rashes or lesions ?Neurologic: Alert and oriented X 3, normal strength and tone. Normal symmetric reflexes. Normal coordination and gait ? 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 The overall alignment is neutral. The bone quality appears to be good for age and reported activity level. ? ?Assessment/Plan: ?Right knee meniscal tear ? ?Right knee arthroscopy ? ?[name] ?09/20/2021, 6:36 AM  ?

## 2022-07-23 ENCOUNTER — Other Ambulatory Visit: Payer: Self-pay | Admitting: Family Medicine

## 2022-07-23 DIAGNOSIS — Z1231 Encounter for screening mammogram for malignant neoplasm of breast: Secondary | ICD-10-CM

## 2022-08-10 ENCOUNTER — Ambulatory Visit
Admission: RE | Admit: 2022-08-10 | Discharge: 2022-08-10 | Disposition: A | Discharge status: Home / Self Care | Payer: BC Managed Care – PPO | Source: Ambulatory Visit | Attending: Family Medicine | Admitting: Family Medicine

## 2022-08-10 DIAGNOSIS — Z1231 Encounter for screening mammogram for malignant neoplasm of breast: Secondary | ICD-10-CM | POA: Insufficient documentation

## 2022-10-28 IMAGING — MG MM DIGITAL SCREENING BILAT W/ TOMO AND CAD
8 series · 8 of 24 positions shown · non-contrast
Comparison: Previous exam(s).

CLINICAL DATA: Screening.

EXAM:
DIGITAL SCREENING BILATERAL MAMMOGRAM WITH TOMOSYNTHESIS AND CAD
TECHNIQUE: Bilateral screening digital craniocaudal and mediolateral oblique
mammograms were obtained. Bilateral screening digital breast
tomosynthesis was performed. The images were evaluated with
computer-aided detection.

[L MLO synth-2D]
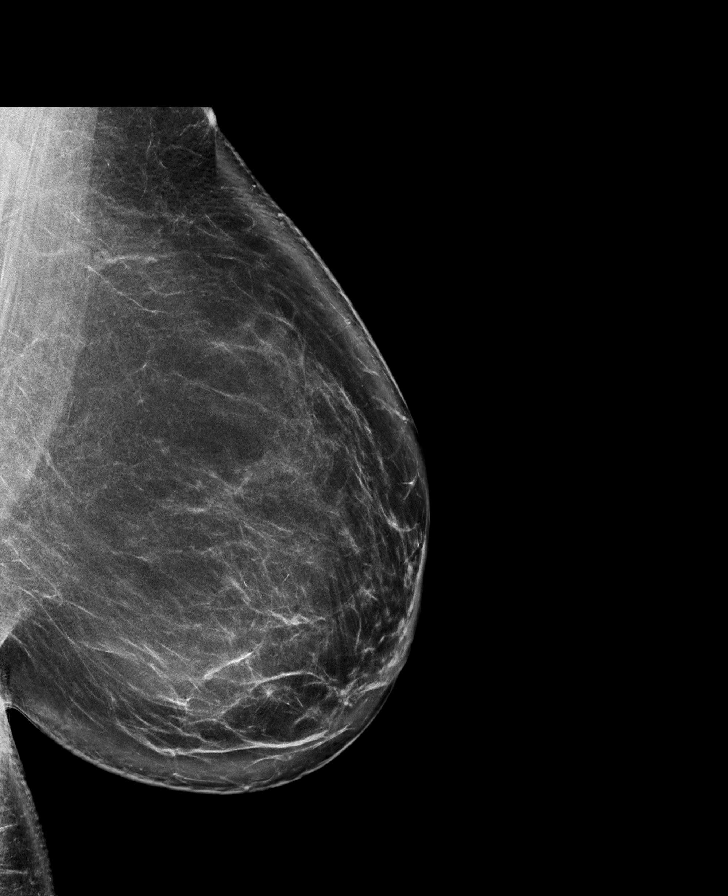

[R CC synth-2D]
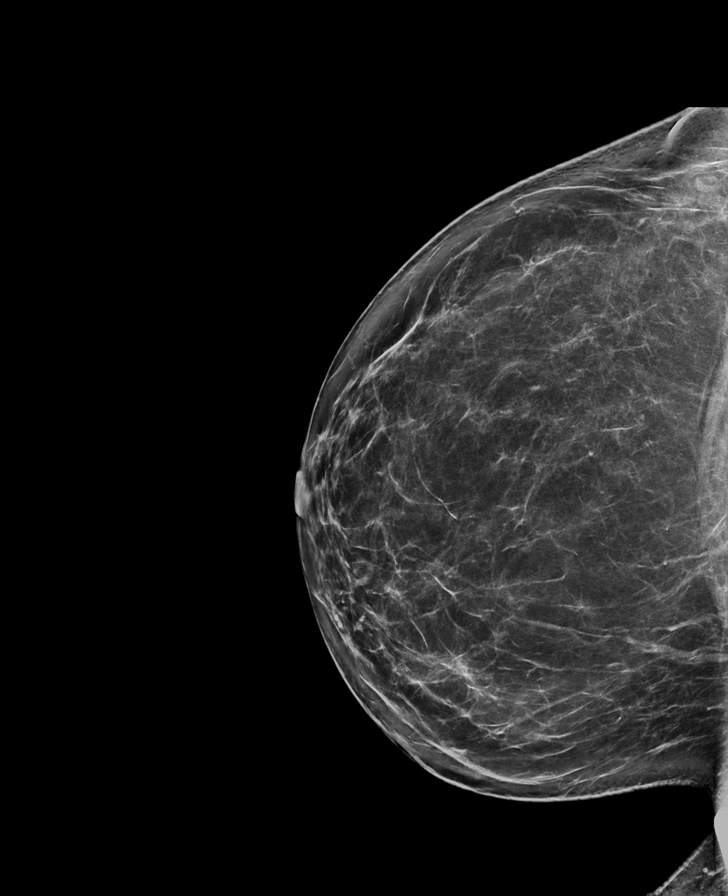

[R MLO synth-2D]
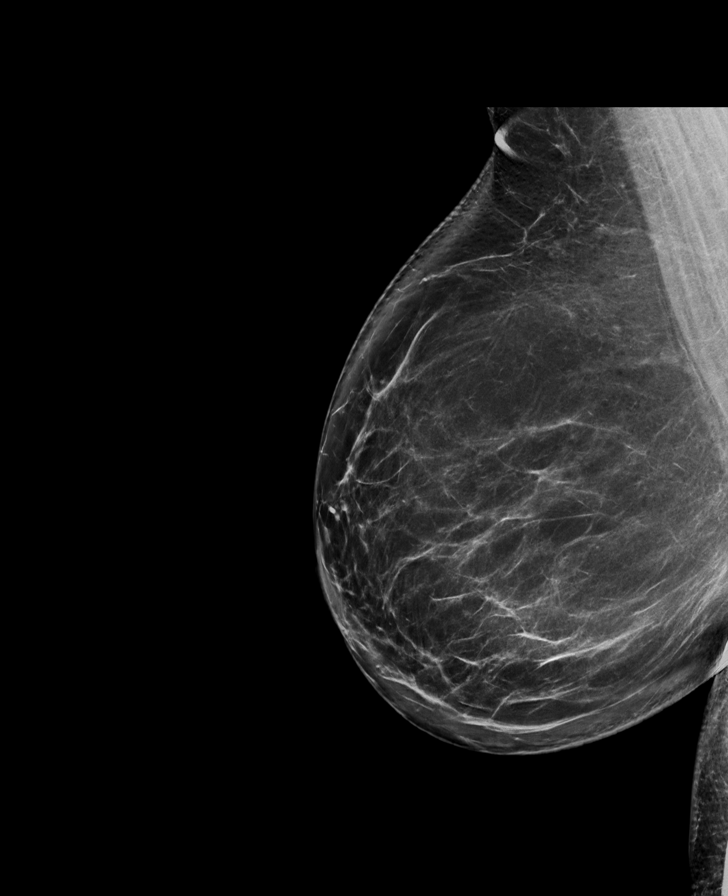

[L CC synth-2D]
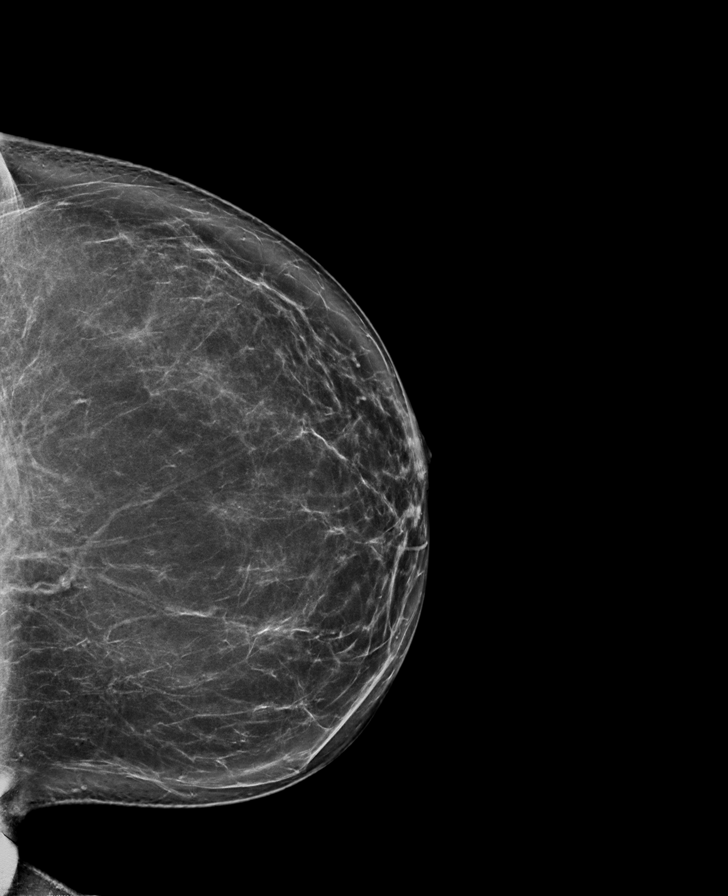

[L MLO tomo · tomo slice 56/111.0]
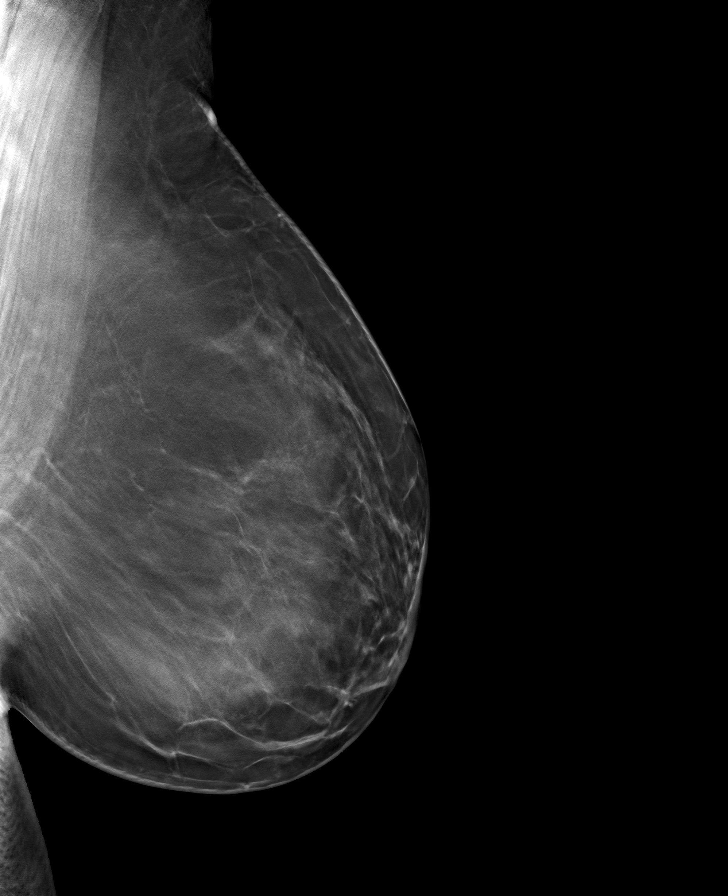

[L CC tomo · tomo slice 47/93.0]
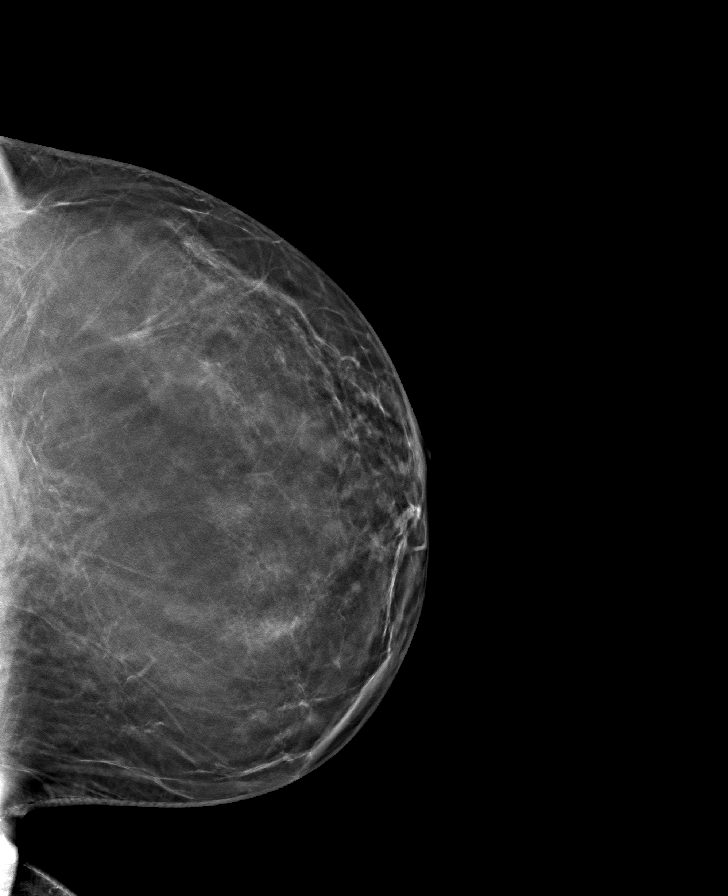

[R MLO tomo · tomo slice 52/103.0]
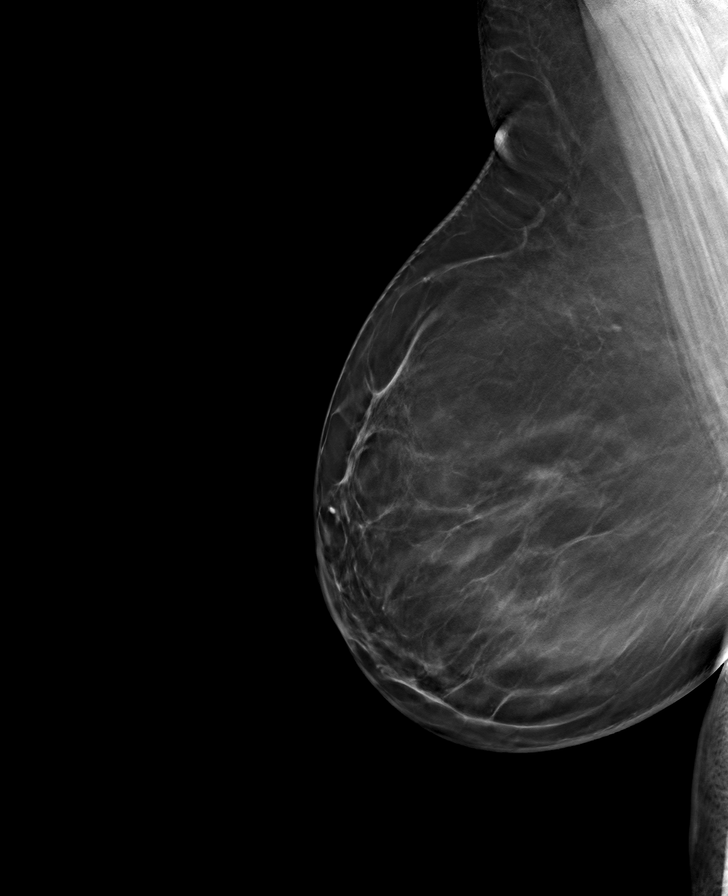

[R CC tomo · tomo slice 43/85.0]
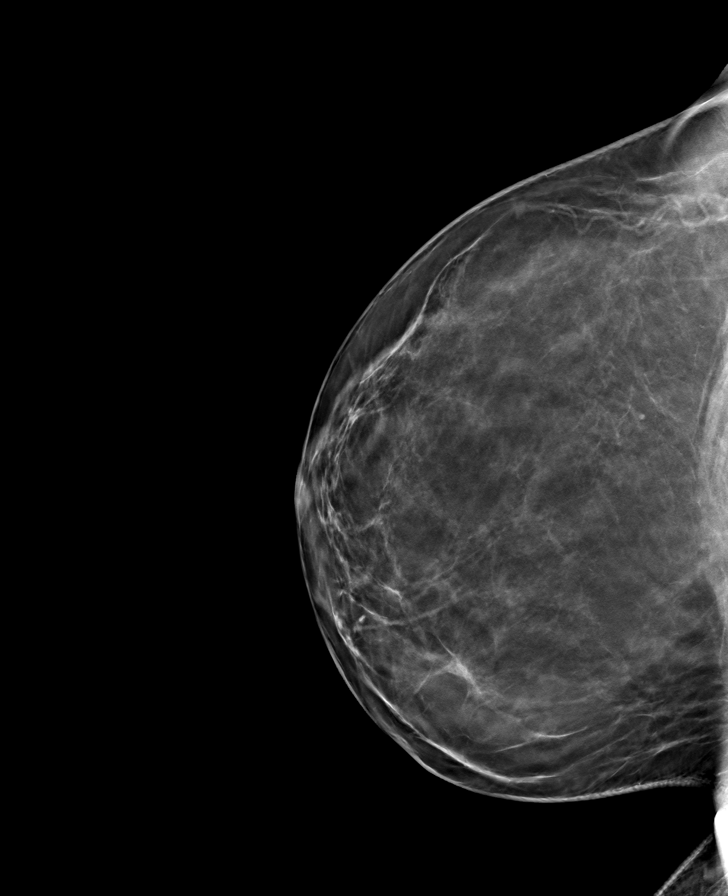

[8 of 24 positions shown; findings below may reference images not displayed]

ACR Breast Density Category b: There are scattered areas of
fibroglandular density.
FINDINGS: There are no findings suspicious for malignancy.
IMPRESSION: No mammographic evidence of malignancy. A result letter of this
screening mammogram will be mailed directly to the patient.

RECOMMENDATION:
Screening mammogram in one year. (Code:51-O-LD2)

BI-RADS CATEGORY  1: Negative.

## 2023-07-05 ENCOUNTER — Other Ambulatory Visit: Payer: Self-pay | Admitting: Family Medicine

## 2023-07-05 DIAGNOSIS — Z1231 Encounter for screening mammogram for malignant neoplasm of breast: Secondary | ICD-10-CM

## 2023-08-12 ENCOUNTER — Ambulatory Visit
Admission: RE | Admit: 2023-08-12 | Discharge: 2023-08-12 | Disposition: A | Discharge status: Home / Self Care | Payer: Medicaid Other | Source: Ambulatory Visit | Attending: Family Medicine | Admitting: Family Medicine

## 2023-08-12 DIAGNOSIS — Z1231 Encounter for screening mammogram for malignant neoplasm of breast: Secondary | ICD-10-CM | POA: Diagnosis present

## 2023-09-29 ENCOUNTER — Other Ambulatory Visit: Payer: Self-pay

## 2023-09-29 ENCOUNTER — Emergency Department: Admission: EM | Admit: 2023-09-29 | Discharge: 2023-09-30 | Disposition: A | Discharge status: Home / Self Care

## 2023-09-29 ENCOUNTER — Encounter: Payer: Self-pay | Admitting: Emergency Medicine

## 2023-09-29 DIAGNOSIS — I10 Essential (primary) hypertension: Secondary | ICD-10-CM | POA: Insufficient documentation

## 2023-09-29 DIAGNOSIS — E119 Type 2 diabetes mellitus without complications: Secondary | ICD-10-CM | POA: Diagnosis not present

## 2023-09-29 DIAGNOSIS — Z79899 Other long term (current) drug therapy: Secondary | ICD-10-CM | POA: Diagnosis not present

## 2023-09-29 NOTE — ED Provider Notes (Signed)
 [facility] Provider Note    Event Date/Time   First MD Initiated Contact with Patient 09/29/23 2228     (approximate)   History   Hypertension  Pt reports hypertensive at home (185/86), pt reports she recently changed her PCP who changed her BP meds, has had HTN since, PCP advised to report to EDP if continues    HPI Melinda Bean is a 63 y.o. female PMH DM2, hyperlipidemia, hypertension, GERD presents for evaluation of hypertension -No chest pain, shortness of breath, headache, vision change, confusion -Is on multiple home antihypertensive medications, has recently noticed that her blood pressures have been in the 170s, 180s so decided to come to emergency department for eval -Currently on amlodipine, hydrochlorothiazide, metoprolol      Physical Exam   Triage Vital Signs: ED Triage Vitals  Encounter Vitals Group     BP 09/29/23 2045 (!) 182/80     Systolic BP Percentile --      Diastolic BP Percentile --      Pulse Rate 09/29/23 2045 65     Resp 09/29/23 2045 16     Temp 09/29/23 2045 98.5 F (36.9 C)     Temp Source 09/29/23 2045 Oral     SpO2 09/29/23 2045 99 %     Weight 09/29/23 2043 208 lb (94.3 kg)     Height 09/29/23 2043 5' 8.5" (1.74 m)     Head Circumference --      Peak Flow --      Pain Score 09/29/23 2043 0     Pain Loc --      Pain Education --      Exclude from Growth Chart --     Most recent vital signs: Vitals:   09/29/23 2045  BP: (!) 182/80  Pulse: 65  Resp: 16  Temp: 98.5 F (36.9 C)  SpO2: 99%     General: Awake, no distress.  CV:  Good peripheral perfusion. RRR, RP 2+ Resp:  Normal effort. CTAB Abd:  No distention.     ED Results / Procedures / Treatments   Labs (all labs ordered are listed, but only abnormal results are displayed) Labs Reviewed - No data to display   EKG  ED course below   RADIOLOGY N/a   PROCEDURES:  Critical Care performed: No  Procedures   MEDICATIONS  ORDERED IN ED: Medications - No data to display   IMPRESSION / MDM / ASSESSMENT AND PLAN / ED COURSE  I reviewed the triage vital signs and the nursing notes.                              DDX/MDM/AP: Differential diagnosis includes, but is not limited to, asymptomatic hypertension.  No clinical concern for hypertensive emergency at this time and is asymptomatic patient is very well-appearing.  Initial blood pressure 182/80 at triage, repeat 160/87 on my reeval.  EKG nonischemic.  Indication for further intervention at this time.  Discussed with patient possibility of increasing hydrochlorothiazide (is already maxed out on amlodipine and is mildly bradycardic here, will avoid increasing metoprolol at this time) --patient prefers to avoid further medication change at this time and follow-up with PMD which I believe is very reasonable.  Plan: -Continue antihypertensives, PMD follow-up  Patient's presentation is most consistent with exacerbation of chronic illness.    ED course below.  Plan for PMD follow-up, can consider increasing hydrochlorothiazide as needed.  ED return precautions in place.  Patient and family agree with plan.  Clinical Course as of 09/30/23 0126  Sun Sep 29, 2023  2353 Ecg = sinus bradycardia, no ST elevation or depression, no significant repolarization abnormality, normal axis, normal intervals.  No evidence of ischemia on my read. [MM]  Mon Sep 30, 2023  0002 Patient reevaluated, remains asymptomatic.  Repeat blood pressure 160/87.  Is already on maximum dose of amlodipine, could increase hydrochlorothiazide, would not increase metoprolol given patient is mildly bradycardic here.  In shared decision-making, patient is comfortable deferring any medication management at this time and following up with her primary care doctor.  ED return precautions in place.  Patient and family agree with plan. [MM]    Clinical Course User Index [MM] Collis Deaner, MD     FINAL  CLINICAL IMPRESSION(S) / ED DIAGNOSES   Final diagnoses:  Asymptomatic hypertension     Rx / DC Orders   ED Discharge Orders     None        Note:  This document was prepared using Dragon voice recognition software and may include unintentional dictation errors.   Collis Deaner, MD 09/30/23 [phone]

## 2023-09-29 NOTE — Discharge Instructions (Addendum)
 Your evaluation in the emergency department was notable for an elevated blood pressure, but your exam was otherwise reassuring.  As discussed, you are already on 3 appropriate blood pressure medications, and I recommend you continue all of these and call your doctor tomorrow to discuss further management.  Return to the emergency department with any new or worsening symptoms.

## 2023-09-29 NOTE — ED Triage Notes (Signed)
 Pt reports hypertensive at home (185/86), pt reports she recently changed her PCP who changed her BP meds, has had HTN since, PCP advised to report to EDP if continues

## 2024-05-25 ENCOUNTER — Other Ambulatory Visit: Payer: Self-pay | Admitting: Cardiology

## 2024-05-25 DIAGNOSIS — I10 Essential (primary) hypertension: Secondary | ICD-10-CM

## 2024-05-29 ENCOUNTER — Ambulatory Visit
Admission: RE | Admit: 2024-05-29 | Discharge: 2024-05-29 | Disposition: A | Source: Ambulatory Visit | Attending: Cardiology

## 2024-05-29 DIAGNOSIS — R93421 Abnormal radiologic findings on diagnostic imaging of right kidney: Secondary | ICD-10-CM | POA: Diagnosis not present

## 2024-05-29 DIAGNOSIS — I10 Essential (primary) hypertension: Secondary | ICD-10-CM | POA: Diagnosis present

## 2024-06-02 ENCOUNTER — Other Ambulatory Visit: Payer: Self-pay | Admitting: Physician Assistant

## 2024-06-02 DIAGNOSIS — I10 Essential (primary) hypertension: Secondary | ICD-10-CM

## 2024-06-09 ENCOUNTER — Ambulatory Visit
Admission: RE | Admit: 2024-06-09 | Discharge: 2024-06-09 | Disposition: A | Discharge status: Home / Self Care | Source: Ambulatory Visit | Attending: Physician Assistant | Admitting: Physician Assistant

## 2024-06-09 DIAGNOSIS — I10 Essential (primary) hypertension: Secondary | ICD-10-CM | POA: Diagnosis present

## 2024-06-09 LAB — POCT I-STAT CREATININE: Creatinine, Ser: 0.9 mg/dL (ref 0.44–1.00)

## 2024-06-09 MED ORDER — IOHEXOL 350 MG/ML SOLN
100.0000 mL | Freq: Once | INTRAVENOUS | Status: AC | PRN
  Administered 2024-06-09: 100 mL via INTRAVENOUS

## 2024-07-03 ENCOUNTER — Other Ambulatory Visit: Payer: Self-pay | Admitting: Nurse Practitioner

## 2024-07-03 DIAGNOSIS — Z1231 Encounter for screening mammogram for malignant neoplasm of breast: Secondary | ICD-10-CM

## 2024-08-12 ENCOUNTER — Encounter
